# Patient Record
Sex: Female | Born: 1985 | Race: White | Hispanic: No | Smoking: Never smoker
Health system: Southern US, Community
[De-identification: ages and names within clinical notes are randomized; demographics above are authoritative.]

## PROBLEM LIST (undated history)

## (undated) DIAGNOSIS — M1990 Unspecified osteoarthritis, unspecified site: Secondary | ICD-10-CM

## (undated) DIAGNOSIS — F419 Anxiety disorder, unspecified: Secondary | ICD-10-CM

## (undated) DIAGNOSIS — J45909 Unspecified asthma, uncomplicated: Secondary | ICD-10-CM

## (undated) DIAGNOSIS — D649 Anemia, unspecified: Secondary | ICD-10-CM

## (undated) HISTORY — DX: Anemia, unspecified: D64.9

## (undated) HISTORY — DX: Unspecified asthma, uncomplicated: J45.909

## (undated) HISTORY — DX: Anxiety disorder, unspecified: F41.9

## (undated) HISTORY — DX: Unspecified osteoarthritis, unspecified site: M19.90

---
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
## 2007-01-17 ENCOUNTER — Ambulatory Visit: Payer: Self-pay | Admitting: Family Medicine

## 2008-07-18 ENCOUNTER — Telehealth: Payer: Self-pay | Admitting: Family Medicine

## 2008-07-18 ENCOUNTER — Emergency Department (HOSPITAL_COMMUNITY): Admission: EM | Admit: 2008-07-18 | Discharge: 2008-07-18 | Payer: Self-pay | Admitting: Emergency Medicine

## 2010-02-19 ENCOUNTER — Ambulatory Visit: Payer: Self-pay | Admitting: Internal Medicine

## 2010-02-19 LAB — CONVERTED CEMR LAB
ALT: 47 units/L — ABNORMAL HIGH (ref 0–35)
AST: 37 units/L (ref 0–37)
Albumin: 3.7 g/dL (ref 3.5–5.2)
Alkaline Phosphatase: 96 units/L (ref 39–117)
Basophils Absolute: 0 10*3/uL (ref 0.0–0.1)
Basophils Relative: 0.5 % (ref 0.0–3.0)
Calcium: 8.9 mg/dL (ref 8.4–10.5)
Eosinophils Relative: 0.7 % (ref 0.0–5.0)
GFR calc non Af Amer: 103.9 mL/min (ref >60)
Glucose, Urine, Semiquant: NEGATIVE
HCT: 34.8 % — ABNORMAL LOW (ref 36.0–46.0)
HDL: 45.2 mg/dL (ref >39.00)
Hemoglobin: 11.9 g/dL — ABNORMAL LOW (ref 12.0–15.0)
LDL Cholesterol: 116 mg/dL — ABNORMAL HIGH (ref 0–99)
Lymphocytes Relative: 54.9 % — ABNORMAL HIGH (ref 12.0–46.0)
Monocytes Relative: 13.7 % — ABNORMAL HIGH (ref 3.0–12.0)
Neutro Abs: 2.3 10*3/uL (ref 1.4–7.7)
Nitrite: NEGATIVE
Potassium: 3.9 meq/L (ref 3.5–5.1)
RBC: 4.01 M/uL (ref 3.87–5.11)
RDW: 14.8 % — ABNORMAL HIGH (ref 11.5–14.6)
Sodium: 137 meq/L (ref 135–145)
Total CHOL/HDL Ratio: 4
VLDL: 18.8 mg/dL (ref 0.0–40.0)
WBC Urine, dipstick: NEGATIVE
WBC: 7.6 10*3/uL (ref 4.5–10.5)

## 2010-02-21 ENCOUNTER — Emergency Department (HOSPITAL_COMMUNITY): Admission: EM | Admit: 2010-02-21 | Discharge: 2010-02-21 | Payer: Self-pay | Admitting: Family Medicine

## 2010-03-05 ENCOUNTER — Ambulatory Visit: Payer: Self-pay | Admitting: Internal Medicine

## 2010-03-05 DIAGNOSIS — D509 Iron deficiency anemia, unspecified: Secondary | ICD-10-CM | POA: Insufficient documentation

## 2010-03-05 DIAGNOSIS — B2790 Infectious mononucleosis, unspecified without complication: Secondary | ICD-10-CM | POA: Insufficient documentation

## 2010-08-26 NOTE — Miscellaneous (Signed)
Summary: 1989 - 1994  1989 - 1994   Imported By: Maryln Gottron 03/10/2010 10:35:45  _____________________________________________________________________  External Attachment:    Type:   Image     Comment:   External Document

## 2010-08-26 NOTE — Assessment & Plan Note (Signed)
Summary: cpx/cjr   Vital Signs:  Patient profile:   25 year old female Height:      66 inches Weight:      186 pounds BMI:     30.13 Temp:     98.2 degrees F oral Pulse rate:   76 / minute Resp:     14 per minute BP sitting:   130 / 80  (left arm)  Vitals Entered By: Willy Eddy, LPN (March 05, 2010 3:03 PM)  Nutrition Counseling: Patient's BMI is greater than 25 and therefore counseled on weight management options. CC: cpx Is Patient Diabetic? No   CC:  cpx.  History of Present Illness: Hx of iron deficiency anemia ( was a regular bloods donor now stopped) took iron for a while and then stolled heavy menstrual cycles fist noted anemia 4 years ago no family hx of thalasemias on father's side does not feel weak The pt was asked about all immunizations, health maint. services that are appropriate to their age and was given guidance on diet exercize  and weight management   Preventive Screening-Counseling & Management  Alcohol-Tobacco     Smoking Status: never     Tobacco Counseling: not indicated; no tobacco use  Problems Prior to Update: None  Current Problems (verified): 1)  Anemia-iron Deficiency  (ICD-280.9)  Medications Prior to Update: 1)  None  Current Medications (verified): 1)  Nortrel 7/7/7 0.5/0.75/1-35 Mg-Mcg Tabs (Norethin-Eth Estrad Triphasic) .... 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  Past Medical History: Anemia-iron deficiency  Family History: Reviewed  history and no changes required. Family History of Asthma Family History Depression Family History High cholesterol Family History Hypertension  Social History: Reviewed history and no changes required. 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    Physical Exam  General:  Well-developed,well-nourished,in no acute distress; alert,appropriate and cooperative throughout examination Eyes:  No corneal or conjunctival inflammation noted. EOMI. Perrla. Funduscopic exam benign, without hemorrhages, exudates or papilledema. Vision grossly normal. Ears:  External ear exam shows no significant lesions or deformities.  Otoscopic examination reveals clear canals, tympanic membranes are intact bilaterally without bulging, retraction, inflammation or discharge. Hearing is grossly normal bilaterally. Nose:  no external deformity and no nasal discharge.   Mouth:  good dentition and pharynx pink and moist.   Lungs:  Normal respiratory effort, chest expands symmetrically. Lungs are clear to auscultation, no crackles or wheezes. Heart:  Normal rate and regular rhythm. S1 and S2 normal without gallop, murmur, click, rub or other extra sounds. Abdomen:  Bowel sounds positive,abdomen soft and non-tender without masses,question of spleen tip y or hernias noted. Msk:  No deformity or scoliosis noted of thoracic or lumbar spine.   Pulses:  R and L  carotid,radial,femoral,dorsalis pedis and posterior tibial pulses are full and equal bilaterally Extremities:  No clubbing, cyanosis, edema, or deformity noted with normal full range of motion of all joints.   Neurologic:  No cranial nerve deficits noted. Station and gait are normal. Plantar reflexes are down-going bilaterally. DTRs are symmetrical throughout. Sensory, motor and coordinative functions appear intact.   Impression & Recommendations:  Problem # 1:  ANEMIA-IRON DEFICIENCY (ICD-280.9)  Hgb: 11.9 (02/19/2010)   Hct: 34.8 (02/19/2010)   Platelets: 211.0 (02/19/2010) RBC: 4.01 (02/19/2010)   RDW: 14.8 (02/19/2010)   WBC: 7.6 (02/19/2010) MCV: 86.7 (02/19/2010)   MCHC: 34.2 (02/19/2010) TSH: 3.59 (02/19/2010)  Problem # 2:  PREVENTIVE HEALTH CARE (ICD-V70.0) The pt was asked about all immunizations, health maint. services that are appropriate to their age and was given guidance on diet exercize  and weight management  Pap smear: normal (03/01/2010) Td Booster: Historical (01/26/2006)   Chol: 180 (02/19/2010)   HDL: 45.20 (02/19/2010)   LDL: 116 (02/19/2010)   TG: 94.0 (02/19/2010) TSH: 3.59 (02/19/2010)    Discussed using sunscreen, use of alcohol, drug use, self breast exam, routine dental care, routine eye care, schedule for GYN exam, routine physical exam, seat belts, multiple vitamins, osteoporosis prevention, adequate calcium intake in diet, recommendations for immunizations, mammograms and Pap smears.  Discussed exercise and checking cholesterol.  Discussed gun safety, safe sex, and contraception.  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 Use as directed   Immunization  History:  Tetanus/Td Immunization History:    Tetanus/Td:  historical (01/26/2006)    Preventive Care Screening  Pap Smear:    Date:  03/01/2010    Next Due:  02/2011    Results:  normal   Last Tetanus Booster:    Date:  01/26/2006    Results:  Historical

## 2010-09-17 ENCOUNTER — Telehealth: Payer: Self-pay | Admitting: Internal Medicine

## 2010-09-17 NOTE — Telephone Encounter (Signed)
Per dr Lovell Sheehan- she need to go to er for evaluation- mom informed

## 2010-09-17 NOTE — Telephone Encounter (Signed)
Pt has kidney stones. Has been having pain in back that has moved to side. Pts ankles, feet and legs are extremely swollen and pt is very bloated as well.Pts mom is wondering if these anything they could do to help stones pass? Pt leaving to go back to [place] on Tues a.m.  [facility] [address]

## 2011-02-25 ENCOUNTER — Encounter: Payer: Self-pay | Admitting: Internal Medicine

## 2011-03-04 ENCOUNTER — Ambulatory Visit: Payer: Self-pay | Admitting: Internal Medicine

## 2011-04-28 LAB — RAPID STREP SCREEN (MED CTR MEBANE ONLY): Streptococcus, Group A Screen (Direct): NEGATIVE

## 2013-06-19 ENCOUNTER — Encounter: Payer: Self-pay | Admitting: Family

## 2013-06-19 DIAGNOSIS — Z0289 Encounter for other administrative examinations: Secondary | ICD-10-CM

## 2022-09-09 ENCOUNTER — Ambulatory Visit (INDEPENDENT_AMBULATORY_CARE_PROVIDER_SITE_OTHER): Payer: 59 | Admitting: Podiatry

## 2022-09-09 VITALS — BP 125/86

## 2022-09-09 DIAGNOSIS — L600 Ingrowing nail: Secondary | ICD-10-CM

## 2022-09-12 NOTE — Progress Notes (Signed)
   Chief Complaint  Patient presents with   Ingrown Toenail    Patient came in today for bilateral ingrown toenails which started 3 years ago, patient also has 5th toenails that split,     Subjective: Patient presents today for evaluation of pain to the bilateral great toes as well as the fifth digit toenail plates. Patient is concerned for possible ingrown nail.  It is very sensitive to touch.  This has been an ongoing issue for a few years now.  Patient states that the fifth toenails also have a nail spicule that grows and she continues to have to pull it out.  Patient presents today for further treatment and evaluation.  Past Medical History:  Diagnosis Date   Anemia    Not on File  Objective:  General: Well developed, nourished, in no acute distress, alert and oriented x3   Dermatology: Skin is warm, dry and supple bilateral.  Bilateral great toenail plates as well as the lateral border of the fifth digits bilaterally is tender with evidence of an ingrowing nail. Pain on palpation noted to the border of the nail fold. The remaining nails appear unremarkable at this time. There are no open sores, lesions.  Vascular: DP and PT pulses palpable.  No clinical evidence of vascular compromise  Neruologic: Grossly intact via light touch bilateral.  Musculoskeletal: No pedal deformity noted  Assesement: #1 Paronychia with ingrowing nail bilateral great toes and fifth digits bilateral  Plan of Care:  1. Patient evaluated.  2. Discussed treatment alternatives and plan of care. Explained nail avulsion procedure and post procedure course to patient. 3. Patient opted for permanent partial nail avulsion of the ingrown portion of the nail.  For now we are going to pursue just the ingrown portion of the left great toe and left fifth digit.  When she returns for follow-up we will proceed with the right foot 4. Prior to procedure, local anesthesia infiltration utilized using 3 ml of a 50:50  mixture of 2% plain lidocaine and 0.5% plain marcaine in a normal hallux block fashion and a betadine prep performed.  5. Partial permanent nail avulsion with chemical matrixectomy performed using [id number] applications of phenol followed by alcohol flush.  6. Light dressing applied.  Post care instructions provided 7.  Return to clinic 3 weeks  Edrick Kins, DPM [facility]  Dr. Edrick Kins, DPM    [address], [place] [address]                Office [phone]  Fax [phone]

## 2022-09-30 ENCOUNTER — Ambulatory Visit: Payer: 59 | Admitting: Podiatry

## 2022-10-03 ENCOUNTER — Ambulatory Visit (INDEPENDENT_AMBULATORY_CARE_PROVIDER_SITE_OTHER): Payer: 59 | Admitting: Podiatry

## 2022-10-03 DIAGNOSIS — L600 Ingrowing nail: Secondary | ICD-10-CM | POA: Diagnosis not present

## 2022-10-03 NOTE — Patient Instructions (Signed)

## 2022-10-03 NOTE — Progress Notes (Signed)
   Chief Complaint  Patient presents with   Ingrown Toenail    Left hallux and 5th ingrown removal follow-up     Subjective: Patient presents today for evaluation of pain to the bilateral great toes as well as the fifth digit toenail plates. Patient is concerned for possible ingrown nail.  It is very sensitive to touch.  This has been an ongoing issue for a few years now.  Patient states that the fifth toenails also have a nail spicule that grows and she continues to have to pull it out.  Patient presents today for further treatment and evaluation.  Past Medical History:  Diagnosis Date   Anemia    Not on File  Objective:  General: Well developed, nourished, in no acute distress, alert and oriented x3   Dermatology: Skin is warm, dry and supple bilateral.  Bilateral great toenail plates as well as the lateral border of the fifth digits bilaterally is tender with evidence of an ingrowing nail. Pain on palpation noted to the border of the nail fold. The remaining nails appear unremarkable at this time. There are no open sores, lesions.  Vascular: DP and PT pulses palpable.  No clinical evidence of vascular compromise  Neruologic: Grossly intact via light touch bilateral.  Musculoskeletal: No pedal deformity noted  Assesement: #1 Paronychia with ingrowing nail bilateral great toes and fifth digits bilateral #2 S/P partial nail matricectomy LT hallux MED+LAT borders. LT 5th digit LAT border.   Plan of Care:  1. Patient evaluated.  The left foot is doing very well.  There is no drainage and the appear to be healing routinely and nicely.  She would like to proceed now for partial matricectomy's affecting the right foot 2. Discussed treatment alternatives and plan of care. Explained nail avulsion procedure again with the patient and post procedure course to patient. 3. Patient would like to proceed with permanent partial nail avulsion of the ingrown portion of the nail to the right foot.    4. Prior to procedure, local anesthesia infiltration utilized using 3 ml of a 50:50 mixture of 2% plain lidocaine and 0.5% plain marcaine in a normal hallux block fashion and a betadine prep performed.  5. Partial permanent nail avulsion with chemical matrixectomy performed using [id number] applications of phenol followed by alcohol flush.  6. Light dressing applied.  Post care instructions provided 7.  Return to clinic 3 weeks  Edrick Kins, DPM [facility]  Dr. Edrick Kins, DPM    [address],  [address]                Office [phone]  Fax [phone]

## 2022-10-13 ENCOUNTER — Ambulatory Visit: Payer: 59 | Admitting: Nurse Practitioner

## 2022-10-13 ENCOUNTER — Encounter: Payer: Self-pay | Admitting: Nurse Practitioner

## 2022-10-17 ENCOUNTER — Ambulatory Visit: Payer: 59 | Admitting: Podiatry

## 2022-11-04 ENCOUNTER — Encounter: Payer: Self-pay | Admitting: Podiatry

## 2022-12-01 ENCOUNTER — Ambulatory Visit: Payer: 59 | Admitting: Nurse Practitioner

## 2022-12-01 ENCOUNTER — Encounter: Payer: Self-pay | Admitting: Nurse Practitioner

## 2022-12-01 VITALS — BP 122/78 | HR 88 | Ht 64.75 in | Wt 138.6 lb

## 2022-12-01 DIAGNOSIS — Z0000 Encounter for general adult medical examination without abnormal findings: Secondary | ICD-10-CM | POA: Insufficient documentation

## 2022-12-01 DIAGNOSIS — Z1329 Encounter for screening for other suspected endocrine disorder: Secondary | ICD-10-CM | POA: Diagnosis not present

## 2022-12-01 DIAGNOSIS — Z79899 Other long term (current) drug therapy: Secondary | ICD-10-CM

## 2022-12-01 DIAGNOSIS — L600 Ingrowing nail: Secondary | ICD-10-CM | POA: Diagnosis not present

## 2022-12-01 DIAGNOSIS — Z130 Encounter for screening for diseases of the blood and blood-forming organs and certain disorders involving the immune mechanism: Secondary | ICD-10-CM

## 2022-12-01 DIAGNOSIS — M533 Sacrococcygeal disorders, not elsewhere classified: Secondary | ICD-10-CM

## 2022-12-01 DIAGNOSIS — Z1321 Encounter for screening for nutritional disorder: Secondary | ICD-10-CM

## 2022-12-01 DIAGNOSIS — Z862 Personal history of diseases of the blood and blood-forming organs and certain disorders involving the immune mechanism: Secondary | ICD-10-CM

## 2022-12-01 DIAGNOSIS — Z13228 Encounter for screening for other metabolic disorders: Secondary | ICD-10-CM

## 2022-12-01 DIAGNOSIS — Z8349 Family history of other endocrine, nutritional and metabolic diseases: Secondary | ICD-10-CM | POA: Insufficient documentation

## 2022-12-01 DIAGNOSIS — R519 Headache, unspecified: Secondary | ICD-10-CM

## 2022-12-01 LAB — CBC WITH DIFFERENTIAL/PLATELET
Basos: 1 %
EOS (ABSOLUTE): 0 10*3/uL (ref 0.0–0.4)
Hematocrit: 42.8 % (ref 34.0–46.6)
Hemoglobin: 14.2 g/dL (ref 11.1–15.9)
Immature Granulocytes: 0 %
Lymphs: 30 %
MCHC: 33.2 g/dL (ref 31.5–35.7)
MCV: 90 fL (ref 79–97)
Monocytes Absolute: 0.6 10*3/uL (ref 0.1–0.9)
Neutrophils Absolute: 6.4 10*3/uL (ref 1.4–7.0)
Neutrophils: 63 %
RDW: 11.7 % (ref 11.7–15.4)

## 2022-12-01 LAB — COMPREHENSIVE METABOLIC PANEL

## 2022-12-01 LAB — LIPID PANEL

## 2022-12-01 LAB — VITAMIN B12

## 2022-12-01 NOTE — Progress Notes (Signed)
[name], DNP, AGNP-c [facility] [address] [place], [place] [address] Main Office [phone]  BP 122/78   Pulse 88   Ht 5' 4.75" (1.645 m)   Wt 138 lb 9.6 oz (62.9 kg)   BMI 23.24 kg/m    Subjective:    Patient ID: Sharon Clements, female    DOB: [birth year], 37 y.o.   MRN: [id number]  HPI: Sharon Clements is a 37 y.o. female presenting on 12/01/2022 for comprehensive medical examination.   Current medical concerns include: Sharon Clements expresses concerns today with frequent headaches, occurring 4-5 times per week.  She notes that these headaches are severe but not at the level of migraines.  Sharon Clements attributes the headaches to extensive screen time related to her job, which demands 12 hours of work daily primarily on the computer.  She suspects that an adequate sleep and hydration may also be contributing factors.  Previously she managed the headaches with ibuprofen but since has switched to acetaminophen.  Sharon Clements also mentions a concern regarding possible to send to help bone, and condition she notes her mother has also.  She describes the sensation as palpable and painful, particularly when sitting in a standard position, which forces her to frequently shift her weight or alter her sitting posture to alleviate discomfort.  Additionally, she discusses significant weight loss of 50 to 60 pounds following family bereavement in 2022.  She believes that the weight loss has made the tailbone issue more pronounced.  Sharon Clements also mentions a chronic runny nose, which she asked.  This is year-round and not limited to seasonal allergies.  This condition leads to consistent mucus in her throat, which she feels the need to clear frequently.  Sharon Clements mentions recent concerns about a potentially infected ingrown toenail following a removal procedure in [place].  She describes the toe is tender, dark-colored, and swollen, with increased pain when wearing shoes or  when the foot contacts the shoe.  She is concerned about infection and plans to resume soaking the toe in Epsom salt as part of her care regimen.   Pertinent items are noted in HPI.  IMMUNIZATIONS:   Flu: Flu vaccine postponed until flu season Prevnar 13: Prevnar 13 N/A for this patient Prevnar 20: Prevnar 20 N/A for this patient Pneumovax 23: Pneumovax 23 N/A for this patient Vac Shingrix: Shingrix N/A for this patient HPV: HPV completed, documentation in chart (Dose # 2/2) Tetanus: Tetanus vaccine history is needed to be updated COVID: COVID-vaccine declined at this time.  HEALTH MAINTENANCE: Pap Smear HM Status: Reportedly up-to-date, records needed. Mammogram N/A Colon Cancer Screening N/A Bone Density N/A STI Testing HM Status: was declined  Lung CT N/A  She reports regular vision exams q1-5y: Yes  She reports regular dental exams q 82m:  Yes  The patient eats a regular, healthy diet.  Most Recent Depression Screen:     12/01/2022    2:21 PM  Depression screen PHQ 2/9  Decreased Interest 0  Down, Depressed, Hopeless 0  PHQ - 2 Score 0   Most Recent Anxiety Screen:      No data to display         Most Recent Fall Screen:    12/01/2022    2:20 PM  Fall Risk   Falls in the past year? 0  Number falls in past yr: 0  Injury with Fall? 0  Risk for fall due to : No Fall Risks  Follow up Falls evaluation completed  Past medical history, surgical history, medications, allergies, family history and social history reviewed with patient today and changes made to appropriate areas of the chart.  Past Medical History:  Past Medical History:  Diagnosis Date   Anemia    Family history of B12 deficiency 12/01/2022   Medications:  Current Outpatient Medications on File Prior to Visit  Medication Sig   Biotin 1000 MCG tablet Take 1 tablet by mouth daily.   Cyanocobalamin 2500 MCG TABS Take 1 tablet by mouth daily.   JUNEL 1.5/30 1.5-30 MG-MCG tablet Take 1 tablet by  mouth daily.   Multiple Vitamin (MULTI-VITAMIN) tablet Take 1 tablet by mouth daily.   phentermine (ADIPEX-P) 37.5 MG tablet Take 37.5 mg by mouth daily.   spironolactone (ALDACTONE) 100 MG tablet Take 100 mg by mouth 2 (two) times daily.   Vitamin E 45 MG (100 UNIT) CAPS Take 1 capsule by mouth daily.   tretinoin (RETIN-A) 0.025 % gel 1 APPLICATION ONCE A NIGHT PEA SIZE AMOUNT EXTERNALLY 30 DAYS for 30 days   No current facility-administered medications on file prior to visit.   Surgical History:  History reviewed. No pertinent surgical history. Allergies:  Not on File Family History:  Family History  Problem Relation Age of Onset   Asthma Other        fhx   Depression Other        fhx   Hyperlipidemia Other        fhx   Hypertension Other        fhx       Objective:    BP 122/78   Pulse 88   Ht 5' 4.75" (1.645 m)   Wt 138 lb 9.6 oz (62.9 kg)   BMI 23.24 kg/m   Wt Readings from Last 3 Encounters:  12/01/22 138 lb 9.6 oz (62.9 kg)    Physical Exam Vitals and nursing note reviewed.  Constitutional:      General: She is not in acute distress.    Appearance: Normal appearance.  HENT:     Head: Normocephalic and atraumatic.     Right Ear: Hearing, tympanic membrane, ear canal and external ear normal.     Left Ear: Hearing, tympanic membrane, ear canal and external ear normal.     Nose: Nose normal.     Right Sinus: No maxillary sinus tenderness or frontal sinus tenderness.     Left Sinus: No maxillary sinus tenderness or frontal sinus tenderness.     Mouth/Throat:     Lips: Pink.     Mouth: Mucous membranes are moist.     Pharynx: Oropharynx is clear.  Eyes:     General: Lids are normal. Vision grossly intact.     Extraocular Movements: Extraocular movements intact.     Conjunctiva/sclera: Conjunctivae normal.     Pupils: Pupils are equal, round, and reactive to light.     Funduscopic exam:    Right eye: Red reflex present.        Left eye: Red reflex  present.    Visual Fields: Right eye visual fields normal and left eye visual fields normal.  Neck:     Thyroid: No thyromegaly.     Vascular: No carotid bruit.  Cardiovascular:     Rate and Rhythm: Normal rate and regular rhythm.     Chest Wall: PMI is not displaced.     Pulses: Normal pulses.          Dorsalis pedis pulses are 2+ on the right  side and 2+ on the left side.       Posterior tibial pulses are 2+ on the right side and 2+ on the left side.     Heart sounds: Normal heart sounds. No murmur heard. Pulmonary:     Effort: Pulmonary effort is normal. No respiratory distress.     Breath sounds: Normal breath sounds.  Abdominal:     General: Abdomen is flat. Bowel sounds are normal. There is no distension.     Palpations: Abdomen is soft. There is no hepatomegaly, splenomegaly or mass.     Tenderness: There is no abdominal tenderness. There is no right CVA tenderness, left CVA tenderness, guarding or rebound.  Musculoskeletal:        General: Tenderness present.     Cervical back: Full passive range of motion without pain, normal range of motion and neck supple. No tenderness.     Right lower leg: No edema.     Left lower leg: No edema.       Legs:  Feet:     Left foot:     Toenail Condition: Left toenails are normal.     Comments: Great toe on right foot tender and mildly erythematous with no drainage noted on palpation. Discoloration present to the nail.  Lymphadenopathy:     Cervical: No cervical adenopathy.     Upper Body:     Right upper body: No supraclavicular adenopathy.     Left upper body: No supraclavicular adenopathy.  Skin:    General: Skin is warm and dry.     Capillary Refill: Capillary refill takes less than 2 seconds.     Nails: There is no clubbing.  Neurological:     General: No focal deficit present.     Mental Status: She is alert and oriented to person, place, and time.     GCS: GCS eye subscore is 4. GCS verbal subscore is 5. GCS motor subscore is  6.     Sensory: Sensation is intact.     Motor: Motor function is intact.     Coordination: Coordination is intact.     Gait: Gait is intact.     Deep Tendon Reflexes: Reflexes are normal and symmetric.  Psychiatric:        Attention and Perception: Attention normal.        Mood and Affect: Mood normal.        Speech: Speech normal.        Behavior: Behavior normal. Behavior is cooperative.        Thought Content: Thought content normal.        Cognition and Memory: Cognition and memory normal.        Judgment: Judgment normal.     Results for orders placed or performed in visit on 12/01/22  CBC with Differential/Platelet  Result Value Ref Range   WBC 10.1 3.4 - 10.8 x10E3/uL   RBC 4.77 3.77 - 5.28 x10E6/uL   Hemoglobin 14.2 11.1 - 15.9 g/dL   Hematocrit 40.9 81.1 - 46.6 %   MCV 90 79 - 97 fL   MCH 29.8 26.6 - 33.0 pg   MCHC 33.2 31.5 - 35.7 g/dL   RDW 91.4 78.2 - 95.6 %   Platelets 360 150 - 450 x10E3/uL   Neutrophils 63 Not Estab. %   Lymphs 30 Not Estab. %   Monocytes 6 Not Estab. %   Eos 0 Not Estab. %   Basos 1 Not Estab. %   Neutrophils Absolute  6.4 1.4 - 7.0 x10E3/uL   Lymphocytes Absolute 3.0 0.7 - 3.1 x10E3/uL   Monocytes Absolute 0.6 0.1 - 0.9 x10E3/uL   EOS (ABSOLUTE) 0.0 0.0 - 0.4 x10E3/uL   Basophils Absolute 0.1 0.0 - 0.2 x10E3/uL   Immature Granulocytes 0 Not Estab. %   Immature Grans (Abs) 0.0 0.0 - 0.1 x10E3/uL  Comprehensive metabolic panel  Result Value Ref Range   Glucose 83 70 - 99 mg/dL   BUN 10 6 - 20 mg/dL   Creatinine, Ser 4.09 0.57 - 1.00 mg/dL   eGFR 85 >81 XB/JYN/8.29   BUN/Creatinine Ratio 11 9 - 23   Sodium 134 134 - 144 mmol/L   Potassium 4.3 3.5 - 5.2 mmol/L   Chloride 99 96 - 106 mmol/L   CO2 20 20 - 29 mmol/L   Calcium 10.1 8.7 - 10.2 mg/dL   Total Protein 7.3 6.0 - 8.5 g/dL   Albumin 4.7 3.9 - 4.9 g/dL   Globulin, Total 2.6 1.5 - 4.5 g/dL   Albumin/Globulin Ratio 1.8 1.2 - 2.2   Bilirubin Total 0.6 0.0 - 1.2 mg/dL   Alkaline  Phosphatase 54 44 - 121 IU/L   AST 17 0 - 40 IU/L   ALT 16 0 - 32 IU/L  Vitamin B12  Result Value Ref Range   Vitamin B-12 1,292 (H) 232 - 1,245 pg/mL  Lipid panel  Result Value Ref Range   Cholesterol, Total 205 (H) 100 - 199 mg/dL   Triglycerides 562 0 - 149 mg/dL   HDL 89 >13 mg/dL   VLDL Cholesterol Cal 17 5 - 40 mg/dL   LDL Chol Calc (NIH) 99 0 - 99 mg/dL   Chol/HDL Ratio 2.3 0.0 - 4.4 ratio  VITAMIN D 25 Hydroxy (Vit-D Deficiency, Fractures)  Result Value Ref Range   Vit D, 25-Hydroxy 73.7 30.0 - 100.0 ng/mL  TSH  Result Value Ref Range   TSH 1.670 0.450 - 4.500 uIU/mL         Assessment & Plan:   Problem List Items Addressed This Visit     Encounter for annual physical exam - Primary    CPE completed today.   Labs ordered. Will make changes as necessary based on results.  Review of HM activities and recommendations discussed and provided on AVS Anticipatory guidance, diet, and exercise recommendations provided.  Medications, allergies, and hx reviewed and updated as necessary.  Plan to f/u with CPE in 1 year or sooner for acute/chronic health needs as directed.        Relevant Orders   CBC with Differential/Platelet (Completed)   Comprehensive metabolic panel (Completed)   Vitamin B12 (Completed)   Lipid panel (Completed)   VITAMIN D 25 Hydroxy (Vit-D Deficiency, Fractures) (Completed)   TSH (Completed)   High risk medication use    Chronic use of tretinoin, OCP, spironolactone, phentermine, and vitamin replacement. Will monitor labs today for management.       Relevant Orders   CBC with Differential/Platelet (Completed)   Comprehensive metabolic panel (Completed)   Vitamin B12 (Completed)   Lipid panel (Completed)   VITAMIN D 25 Hydroxy (Vit-D Deficiency, Fractures) (Completed)   TSH (Completed)   History of anemia due to vitamin B12 deficiency    Labs pending.       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 Noted most recently following weight loss. Ongoing issue. No saddle symptoms or alarms present today. 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  Plan: - Try sitting on a neck pillow or donut pillow to help relieve direct pressure when sitting.  - We can always place a referral to a spinal specialist for further evaluation, if you would like.       Frequent headaches    Sharon Clements has been experiencing 4-5 headaches a week, not at migraine level, but still significant. We discussed possible etiology and treatment options that may be helpful today.  Plan: -Sample of Nurtec has been provided for patient to try as needed to see if this is helpful with breaking the cycle headaches. -Avoid using Tylenol or ibuprofen on a daily basis as this can cause rebound headaches - If symptoms worsen or do not improve, we can consider preventative medications.       Ingrown toenail    Tenderness, discoloration, and swelling noted to the great toe on the right foot following recent procedure with podiatry.  Plan: - Resume soaking in Epsom salts daily - Avoid wearing tight fitting shoes to prevent increased pressure and friction on the area - Contact podiatry if you note any signs of drainage or worsening symptoms to indicate possible infection.       Other Visit Diagnoses     Health care maintenance       Relevant Orders   CBC with Differential/Platelet (Completed)   Comprehensive metabolic panel (Completed)   Vitamin B12 (Completed)   Lipid panel (Completed)   VITAMIN D 25 Hydroxy (Vit-D Deficiency, Fractures) (Completed)   TSH (Completed)   Screening for endocrine, nutritional, metabolic and immunity disorder        Relevant Orders   CBC with Differential/Platelet (Completed)   Comprehensive metabolic panel (Completed)   Vitamin B12 (Completed)   Lipid panel (Completed)   VITAMIN D 25 Hydroxy (Vit-D Deficiency, Fractures) (Completed)   TSH (Completed)          Follow up plan: Return in about 1 year (around 12/01/2023) for CPE.  NEXT PREVENTATIVE PHYSICAL DUE IN 1 YEAR.  PATIENT COUNSELING PROVIDED FOR ALL ADULT PATIENTS: A well balanced diet low in saturated fats, cholesterol, and moderation in carbohydrates.  This can be as simple as monitoring portion sizes and cutting back on sugary beverages such as soda and juice to start with.    Daily water consumption of at least 64 ounces.  Physical activity at least 180 minutes per week.  If just starting out, start 10 minutes a day and work your way up.   This can be as simple as taking the stairs instead of the elevator and walking 2-3 laps around the office  purposefully every day.   STD protection, partner selection, and regular testing if high risk.  Limited consumption of alcoholic beverages if alcohol is consumed. For men, I recommend no more than 14 alcoholic beverages per week, spread out throughout the week (max 2 per day). Avoid "binge" drinking or consuming large quantities of alcohol in one setting.  Please let me know if you feel you may need help with reduction or quitting alcohol consumption.   Avoidance of nicotine, if used. Please let me know if you feel you may need help with reduction or quitting nicotine use.   Daily mental health attention. This can  be in the form of 5 minute daily meditation, prayer, journaling, yoga, reflection, etc.  Purposeful attention to your emotions and mental state can significantly improve your overall wellbeing  and  Health.  Please know that I am here to help you with all of your health care goals and am happy to work with you to find a solution that works best for you.  The greatest advice I  have received with any changes in life are to take it one step at a time, that even means if all you can focus on is the next 60 seconds, then do that and celebrate your victories.  With any changes in life, you will have set backs, and that is OK. The important thing to remember is, if you have a set back, it is not a failure, it is an opportunity to try again! Screening Testing Mammogram Every 1 -2 years based on history and risk factors Starting at age 54 Pap Smear Ages 21-39 every 3 years Ages 22-65 every 5 years with HPV testing More frequent testing may be required based on results and history Colon Cancer Screening Every 1-10 years based on test performed, risk factors, and history Starting at age 65 Bone Density Screening Every 2-10 years based on history Starting at age 73 for women Recommendations for men differ based on medication usage, history, and risk factors AAA Screening One time ultrasound Men 22-20 years old who have every smoked Lung Cancer Screening Low Dose Lung CT every 12 months Age 47-80 years with a 30 pack-year smoking history who still smoke or who have quit within the last 15 years   Screening Labs Routine  Labs: Complete Blood Count (CBC), Complete Metabolic Panel (CMP), Cholesterol (Lipid Panel) Every 6-12 months based on history and medications May be recommended more frequently based on current conditions or previous results Hemoglobin A1c Lab Every 3-12 months based on history and previous results Starting at age 72 or earlier with diagnosis of diabetes, high cholesterol, BMI >26, and/or risk factors Frequent monitoring for patients with diabetes to ensure blood sugar control Thyroid Panel (TSH) Every 6 months based on history, symptoms, and risk factors May be repeated more often if on medication HIV One time testing for all patients 78 and older May be repeated more frequently for patients with increased risk factors or exposure Hepatitis  C One time testing for all patients 44 and older May be repeated more frequently for patients with increased risk factors or exposure Gonorrhea, Chlamydia Every 12 months for all sexually active persons 13-24 years Additional monitoring may be recommended for those who are considered high risk or who have symptoms Every 12 months for any woman on birth control, regardless of sexual activity PSA Men 71-8 years old with risk factors Additional screening may be recommended from age 1-69 based on risk factors, symptoms, and history  Vaccine Recommendations Tetanus Booster All adults every 10 years Flu Vaccine All patients 6 months and older every year COVID Vaccine All patients 12 years and older Initial dosing with booster May recommend additional booster based on age and health history HPV Vaccine 2 doses all patients age 48-26 Dosing may be considered for patients over 26 Shingles Vaccine (Shingrix) 2 doses all adults 55 years and older Pneumonia (Pneumovax 68) All adults 65 years and older May recommend earlier dosing based on health history One year apart from Prevnar 28 Pneumonia (Prevnar 78) All adults 65 years and older Dosed 1 year after Pneumovax 23 Pneumonia (Prevnar  20) One time alternative to the two dosing of 13 and 23 For all adults with initial dose of 23, 20 is recommended 1 year later For all adults with initial dose of 13, 23 is still recommended as second option 1 year later

## 2022-12-01 NOTE — Patient Instructions (Signed)
I will senda note to the podiatrist about the toe, please watch it carefully.   For all adult patients, I recommend A well balanced diet low in saturated fats, cholesterol, and moderation in carbohydrates.   This can be as simple as monitoring portion sizes and cutting back on sugary beverages such as soda and juice to start with.    Daily water consumption of at least 64 ounces.  Physical activity at least 180 minutes per week, if just starting out.   This can be as simple as taking the stairs instead of the elevator and walking 2-3 laps around the office  purposefully every day.   STD protection, partner selection, and regular testing if high risk.  Limited consumption of alcoholic beverages if alcohol is consumed.  For women, I recommend no more than 7 alcoholic beverages per week, spread out throughout the week.  Avoid "binge" drinking or consuming large quantities of alcohol in one setting.   Please let me know if you feel you may need help with reduction or quitting alcohol consumption.   Avoidance of nicotine, if used.  Please let me know if you feel you may need help with reduction or quitting nicotine use.   Daily mental health attention.  This can be in the form of 5 minute daily meditation, prayer, journaling, yoga, reflection, etc.   Purposeful attention to your emotions and mental state can significantly improve your overall wellbeing  and  Health.  Please know that I am here to help you with all of your health care goals and am happy to work with you to find a solution that works best for you.  The greatest advice I have received with any changes in life are to take it one step at a time, that even means if all you can focus on is the next 60 seconds, then do that and celebrate your victories.  With any changes in life, you will have set backs, and that is OK. The important thing to remember is, if you have a set back, it is not a failure, it is an opportunity to try  again!  Health Maintenance Recommendations Screening Testing Mammogram Every 1 -2 years based on history and risk factors Starting at age 37 Pap Smear Ages 21-39 every 3 years Ages 48-65 every 5 years with HPV testing More frequent testing may be required based on results and history Colon Cancer Screening Every 1-10 years based on test performed, risk factors, and history Starting at age 21 Bone Density Screening Every 2-10 years based on history Starting at age 51 for women Recommendations for men differ based on medication usage, history, and risk factors AAA Screening One time ultrasound Men 44-74 years old who have every smoked Lung Cancer Screening Low Dose Lung CT every 12 months Age 6-80 years with a 30 pack-year smoking history who still smoke or who have quit within the last 15 years  Screening Labs Routine  Labs: Complete Blood Count (CBC), Complete Metabolic Panel (CMP), Cholesterol (Lipid Panel) Every 6-12 months based on history and medications May be recommended more frequently based on current conditions or previous results Hemoglobin A1c Lab Every 3-12 months based on history and previous results Starting at age 71 or earlier with diagnosis of diabetes, high cholesterol, BMI >26, and/or risk factors Frequent monitoring for patients with diabetes to ensure blood sugar control Thyroid Panel (TSH w/ T3 & T4) Every 6 months based on history, symptoms, and risk factors May be repeated more often  if on medication HIV One time testing for all patients 13 and older May be repeated more frequently for patients with increased risk factors or exposure Hepatitis C One time testing for all patients 18 and older May be repeated more frequently for patients with increased risk factors or exposure Gonorrhea, Chlamydia Every 12 months for all sexually active persons 13-24 years Additional monitoring may be recommended for those who are considered high risk or who have  symptoms PSA Men 54-93 years old with risk factors Additional screening may be recommended from age 17-69 based on risk factors, symptoms, and history  Vaccine Recommendations Tetanus Booster All adults every 10 years Flu Vaccine All patients 6 months and older every year COVID Vaccine All patients 12 years and older Initial dosing with booster May recommend additional booster based on age and health history HPV Vaccine 2 doses all patients age 17-26 Dosing may be considered for patients over 26 Shingles Vaccine (Shingrix) 2 doses all adults 55 years and older Pneumonia (Pneumovax 23) All adults 65 years and older May recommend earlier dosing based on health history Pneumonia (Prevnar 38) All adults 65 years and older Dosed 1 year after Pneumovax 23  Additional Screening, Testing, and Vaccinations may be recommended on an individualized basis based on family history, health history, risk factors, and/or exposure.

## 2022-12-02 LAB — COMPREHENSIVE METABOLIC PANEL
Bilirubin Total: 0.6 mg/dL (ref 0.0–1.2)
Chloride: 99 mmol/L (ref 96–106)
Creatinine, Ser: 0.9 mg/dL (ref 0.57–1.00)
Glucose: 83 mg/dL (ref 70–99)
Potassium: 4.3 mmol/L (ref 3.5–5.2)
eGFR: 85 mL/min/{1.73_m2} (ref >59)

## 2022-12-02 LAB — VITAMIN D 25 HYDROXY (VIT D DEFICIENCY, FRACTURES): Vit D, 25-Hydroxy: 73.7 ng/mL (ref 30.0–100.0)

## 2022-12-02 LAB — CBC WITH DIFFERENTIAL/PLATELET
Basophils Absolute: 0.1 10*3/uL (ref 0.0–0.2)
Eos: 0 %
Immature Grans (Abs): 0 10*3/uL (ref 0.0–0.1)
Lymphocytes Absolute: 3 10*3/uL (ref 0.7–3.1)
MCH: 29.8 pg (ref 26.6–33.0)
Monocytes: 6 %
Platelets: 360 10*3/uL (ref 150–450)
RBC: 4.77 x10E6/uL (ref 3.77–5.28)
WBC: 10.1 10*3/uL (ref 3.4–10.8)

## 2022-12-02 LAB — LIPID PANEL
Chol/HDL Ratio: 2.3 ratio (ref 0.0–4.4)
LDL Chol Calc (NIH): 99 mg/dL (ref 0–99)
Triglycerides: 100 mg/dL (ref 0–149)

## 2022-12-02 LAB — TSH: TSH: 1.67 u[IU]/mL (ref 0.450–4.500)

## 2022-12-20 ENCOUNTER — Encounter: Payer: Self-pay | Admitting: Nurse Practitioner

## 2022-12-20 DIAGNOSIS — M533 Sacrococcygeal disorders, not elsewhere classified: Secondary | ICD-10-CM

## 2022-12-20 DIAGNOSIS — L600 Ingrowing nail: Secondary | ICD-10-CM

## 2022-12-20 DIAGNOSIS — R519 Headache, unspecified: Secondary | ICD-10-CM | POA: Insufficient documentation

## 2022-12-20 HISTORY — DX: Ingrowing nail: L60.0

## 2022-12-20 HISTORY — DX: Sacrococcygeal disorders, not elsewhere classified: M53.3

## 2022-12-20 NOTE — Assessment & Plan Note (Signed)
Tenderness, discoloration, and swelling noted to the great toe on the right foot following recent procedure with podiatry.  Plan: - Resume soaking in Epsom salts daily - Avoid wearing tight fitting shoes to prevent increased pressure and friction on the area - Contact podiatry if you note any signs of drainage or worsening symptoms to indicate possible infection.

## 2022-12-20 NOTE — Assessment & Plan Note (Signed)
Chronic use of tretinoin, OCP, spironolactone, phentermine, and vitamin replacement. Will monitor labs today for management.

## 2022-12-20 NOTE — Assessment & Plan Note (Signed)
Labs pending.  

## 2022-12-20 NOTE — Assessment & Plan Note (Signed)
CPE completed today.   Labs ordered. Will make changes as necessary based on results.  Review of HM activities and recommendations discussed and provided on AVS Anticipatory guidance, diet, and exercise recommendations provided.  Medications, allergies, and hx reviewed and updated as necessary.  Plan to f/u with CPE in 1 year or sooner for acute/chronic health needs as directed.   

## 2022-12-20 NOTE — Assessment & Plan Note (Signed)
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 Noted most recently following weight loss. Ongoing issue. No saddle symptoms or alarms present today. 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  Plan: - Try sitting on a neck pillow or donut pillow to help relieve direct pressure when sitting.  - We can always place a referral to a spinal specialist for further evaluation, if you would like.

## 2022-12-20 NOTE — Assessment & Plan Note (Signed)
[name] has been experiencing 4-5 headaches a week, not at migraine level, but still significant. We discussed possible etiology and treatment options that may be helpful today.  Plan: -Sample of Nurtec has been provided for patient to try as needed to see if this is helpful with breaking the cycle headaches. -Avoid using Tylenol or ibuprofen on a daily basis as this can cause rebound headaches - If symptoms worsen or do not improve, we can consider preventative medications.

## 2022-12-22 ENCOUNTER — Encounter: Payer: Self-pay | Admitting: Nurse Practitioner

## 2022-12-28 ENCOUNTER — Ambulatory Visit (INDEPENDENT_AMBULATORY_CARE_PROVIDER_SITE_OTHER): Payer: 59 | Admitting: Podiatry

## 2022-12-28 DIAGNOSIS — L600 Ingrowing nail: Secondary | ICD-10-CM

## 2022-12-28 NOTE — Progress Notes (Signed)
   Chief Complaint  Patient presents with   Ingrown Toenail    Ingrown nail check right hallux, and bilateral 5th toes,     Subjective: Patient presents today for follow-up evaluation after partial nail matricectomy to the medial border of the bilateral great toes as well as the fifth digits bilateral.  She states that she has had some sensitivity and tenderness to the medial border of the right great toe.  This is been ongoing for about a month now.  Presenting for follow-up treatment and evaluation  Past Medical History:  Diagnosis Date   Anemia    Family history of B12 deficiency 12/01/2022   Not on File  Objective:  General: Well developed, nourished, in no acute distress, alert and oriented x3   Dermatology: Skin is warm, dry and supple bilateral.  There does appear to be a portion of nail and subungual debris embedded within the medial nail fold causing the patient's symptoms.  After debridement the patient did feel some relief  Vascular: DP and PT pulses palpable.  No clinical evidence of vascular compromise  Neruologic: Grossly intact via light touch bilateral.  Musculoskeletal: No pedal deformity noted  Assesement: #1 S/P partial nail matricectomy bilateral great toes and fifth digits  Plan of Care:  -Patient evaluated -Debridement of the nail matricectomy sites was performed today using a tissue nipper.  The patient tolerated this well.  After debridement of the patient did feel some relief -No drainage or clinical indication of recurrent ingrown or infection -Return to clinic as needed  Felecia Shelling, DPM [facility]  Dr. Felecia Shelling, DPM    [address] [place], [place] [address]                Office [phone]  Fax [phone]

## 2022-12-28 NOTE — Patient Instructions (Signed)

## 2023-01-09 ENCOUNTER — Ambulatory Visit: Payer: 59 | Admitting: Podiatry

## 2023-12-08 ENCOUNTER — Encounter: Payer: 59 | Admitting: Nurse Practitioner

## 2023-12-21 ENCOUNTER — Ambulatory Visit (INDEPENDENT_AMBULATORY_CARE_PROVIDER_SITE_OTHER): Admitting: Nurse Practitioner

## 2023-12-21 VITALS — BP 118/80 | HR 61 | Ht 65.0 in | Wt 141.4 lb

## 2023-12-21 DIAGNOSIS — Z862 Personal history of diseases of the blood and blood-forming organs and certain disorders involving the immune mechanism: Secondary | ICD-10-CM

## 2023-12-21 DIAGNOSIS — Z23 Encounter for immunization: Secondary | ICD-10-CM

## 2023-12-21 DIAGNOSIS — L858 Other specified epidermal thickening: Secondary | ICD-10-CM | POA: Insufficient documentation

## 2023-12-21 DIAGNOSIS — K5909 Other constipation: Secondary | ICD-10-CM | POA: Insufficient documentation

## 2023-12-21 DIAGNOSIS — Z0000 Encounter for general adult medical examination without abnormal findings: Secondary | ICD-10-CM | POA: Diagnosis not present

## 2023-12-21 DIAGNOSIS — Z130 Encounter for screening for diseases of the blood and blood-forming organs and certain disorders involving the immune mechanism: Secondary | ICD-10-CM

## 2023-12-21 DIAGNOSIS — Z1329 Encounter for screening for other suspected endocrine disorder: Secondary | ICD-10-CM

## 2023-12-21 DIAGNOSIS — Z79899 Other long term (current) drug therapy: Secondary | ICD-10-CM | POA: Insufficient documentation

## 2023-12-21 DIAGNOSIS — L719 Rosacea, unspecified: Secondary | ICD-10-CM | POA: Insufficient documentation

## 2023-12-21 DIAGNOSIS — Z1321 Encounter for screening for nutritional disorder: Secondary | ICD-10-CM

## 2023-12-21 DIAGNOSIS — D224 Melanocytic nevi of scalp and neck: Secondary | ICD-10-CM | POA: Insufficient documentation

## 2023-12-21 DIAGNOSIS — D2339 Other benign neoplasm of skin of other parts of face: Secondary | ICD-10-CM | POA: Insufficient documentation

## 2023-12-21 DIAGNOSIS — Z8639 Personal history of other endocrine, nutritional and metabolic disease: Secondary | ICD-10-CM | POA: Insufficient documentation

## 2023-12-21 DIAGNOSIS — L700 Acne vulgaris: Secondary | ICD-10-CM | POA: Insufficient documentation

## 2023-12-21 DIAGNOSIS — Z13228 Encounter for screening for other metabolic disorders: Secondary | ICD-10-CM

## 2023-12-21 NOTE — Assessment & Plan Note (Signed)
 Recheck labs

## 2023-12-21 NOTE — Assessment & Plan Note (Signed)
 Chronic constipation likely due to inadequate hydration. Reports difficulty with bowel movements. No current use of laxatives or stool softeners. Discussed potential use of Miralax, which increases intestinal water content, and stool softeners like Colace as alternatives. - Increase water intake - Consider Miralax or Colace if needed

## 2023-12-21 NOTE — Addendum Note (Signed)
 Addended by: Seymour Dapper, Jannine Abreu D on: 12/21/2023 02:55 PM   Modules accepted: Orders

## 2023-12-21 NOTE — Patient Instructions (Signed)

## 2023-12-21 NOTE — Progress Notes (Signed)
 Annella Kief, DNP, AGNP-c [facility] [address] [place], [place] [address] [phone]   ACUTE VISIT- ESTABLISHED PATIENT  Blood pressure 118/80, pulse 61, height 5\' 5"  (1.651 m), weight 141 lb 6.4 oz (64.1 kg), last menstrual period 12/04/2023.  Subjective:  History of Present Illness ANAIYA WISINSKI is a 38 year old female who presents for an annual physical exam.  She is currently taking spironolactone 100 mg twice a day for acne, which has been effective in managing her cystic acne. She used to experience painful cystic lesions but reports significant improvement with no need for dosage adjustment over the years.  She has a history of constipation, which she attributes to inadequate water intake. She manages it by increasing her water consumption, although she finds it challenging to maintain adequate hydration. She has not used any laxatives or stool softeners. She experiences headaches, which she believes are related to dehydration, as they often occur when she realizes she has not consumed enough water.  She has a history of vitamin D  deficiency, for which she takes a supplement every three days. She switched to lactate milk a few years ago to avoid bloating, although she does not have a lactose sensitivity.  No issues with hearing, vision, throat fullness, difficulty swallowing, shortness of breath, chest pain, palpitations, dizziness, numbness, tingling, or swelling in her extremities. No changes in bowel or bladder habits aside from the constipation.  ROS negative except for what is listed in HPI. History, Medications, Surgery, SDOH, and Family History reviewed and updated as appropriate.  Objective:  Physical Exam Vitals and nursing note reviewed.  Constitutional:      General: She is not in acute distress.    Appearance: Normal appearance.  HENT:     Head: Normocephalic.     Right Ear: Tympanic membrane normal.     Left Ear: Tympanic membrane  normal.     Nose: Nose normal.     Mouth/Throat:     Mouth: Mucous membranes are moist.     Pharynx: Oropharynx is clear.  Eyes:     Extraocular Movements: Extraocular movements intact.     Conjunctiva/sclera: Conjunctivae normal.     Pupils: Pupils are equal, round, and reactive to light.  Neck:     Vascular: No carotid bruit.  Cardiovascular:     Rate and Rhythm: Normal rate and regular rhythm.     Pulses: Normal pulses.     Heart sounds: Normal heart sounds.  Pulmonary:     Effort: Pulmonary effort is normal.     Breath sounds: Normal breath sounds.  Abdominal:     General: Bowel sounds are normal. There is no distension.     Palpations: Abdomen is soft.     Tenderness: There is no abdominal tenderness. There is no right CVA tenderness, left CVA tenderness or guarding.  Musculoskeletal:     Cervical back: No tenderness.     Right lower leg: No edema.     Left lower leg: No edema.  Lymphadenopathy:     Cervical: No cervical adenopathy.  Skin:    General: Skin is warm and dry.     Capillary Refill: Capillary refill takes less than 2 seconds.  Neurological:     General: No focal deficit present.     Mental Status: She is alert and oriented to person, place, and time.     Motor: No weakness.  Psychiatric:        Mood and Affect: Mood normal.  Assessment & Plan:   Problem List Items Addressed This Visit     Encounter for annual physical exam - Primary   CPE completed today. Review of HM activities and recommendations discussed and provided on AVS. Anticipatory guidance, diet, and exercise recommendations provided. Medications, allergies, and hx reviewed and updated as necessary. Orders placed as listed below.  Plan: - Labs ordered. Will make changes as necessary based on results.  - I will review these results and send recommendations via MyChart or a telephone call.  - F/U with CPE in 1 year or sooner for acute/chronic health needs as directed.         Relevant Orders   CBC with Differential/Platelet   CMP14+EGFR   Lipid panel   History of anemia due to vitamin B12 deficiency   Recheck labs      Relevant Orders   Vitamin B12   History of vitamin D  deficiency   Recheck labs      Relevant Orders   VITAMIN D  25 Hydroxy (Vit-D Deficiency, Fractures)   Other constipation   Chronic constipation likely due to inadequate hydration. Reports difficulty with bowel movements. No current use of laxatives or stool softeners. Discussed potential use of Miralax, which increases intestinal water content, and stool softeners like Colace as alternatives. - Increase water intake - Consider Miralax or Colace if needed      Other Visit Diagnoses       Screening for endocrine, nutritional, metabolic and immunity disorder         Need for tetanus booster             Annella Kief, DNP, AGNP-c

## 2023-12-21 NOTE — Assessment & Plan Note (Signed)

## 2023-12-22 ENCOUNTER — Ambulatory Visit: Payer: Self-pay | Admitting: Nurse Practitioner

## 2023-12-22 LAB — CMP14+EGFR
ALT: 13 IU/L (ref 0–32)
AST: 14 IU/L (ref 0–40)
Albumin: 4.9 g/dL (ref 3.9–4.9)
Alkaline Phosphatase: 60 IU/L (ref 44–121)
BUN/Creatinine Ratio: 15 (ref 9–23)
BUN: 12 mg/dL (ref 6–20)
Bilirubin Total: 0.3 mg/dL (ref 0.0–1.2)
CO2: 19 mmol/L — ABNORMAL LOW (ref 20–29)
Calcium: 10.3 mg/dL — ABNORMAL HIGH (ref 8.7–10.2)
Chloride: 100 mmol/L (ref 96–106)
Creatinine, Ser: 0.82 mg/dL (ref 0.57–1.00)
Globulin, Total: 2.6 g/dL (ref 1.5–4.5)
Glucose: 92 mg/dL (ref 70–99)
Potassium: 4.2 mmol/L (ref 3.5–5.2)
Sodium: 138 mmol/L (ref 134–144)
Total Protein: 7.5 g/dL (ref 6.0–8.5)
eGFR: 95 mL/min/{1.73_m2} (ref >59)

## 2023-12-22 LAB — CBC WITH DIFFERENTIAL/PLATELET
Basophils Absolute: 0.1 10*3/uL (ref 0.0–0.2)
Basos: 1 %
EOS (ABSOLUTE): 0 10*3/uL (ref 0.0–0.4)
Eos: 0 %
Hematocrit: 42 % (ref 34.0–46.6)
Hemoglobin: 13.8 g/dL (ref 11.1–15.9)
Immature Grans (Abs): 0 10*3/uL (ref 0.0–0.1)
Immature Granulocytes: 0 %
Lymphocytes Absolute: 2.9 10*3/uL (ref 0.7–3.1)
Lymphs: 28 %
MCH: 30.7 pg (ref 26.6–33.0)
MCHC: 32.9 g/dL (ref 31.5–35.7)
MCV: 94 fL (ref 79–97)
Monocytes Absolute: 0.7 10*3/uL (ref 0.1–0.9)
Monocytes: 6 %
Neutrophils Absolute: 6.9 10*3/uL (ref 1.4–7.0)
Neutrophils: 65 %
Platelets: 339 10*3/uL (ref 150–450)
RBC: 4.49 x10E6/uL (ref 3.77–5.28)
RDW: 12.1 % (ref 11.7–15.4)
WBC: 10.6 10*3/uL (ref 3.4–10.8)

## 2023-12-22 LAB — LIPID PANEL
Chol/HDL Ratio: 2.5 ratio (ref 0.0–4.4)
Cholesterol, Total: 214 mg/dL — ABNORMAL HIGH (ref 100–199)
HDL: 87 mg/dL (ref >39)
LDL Chol Calc (NIH): 107 mg/dL — ABNORMAL HIGH (ref 0–99)
Triglycerides: 118 mg/dL (ref 0–149)
VLDL Cholesterol Cal: 20 mg/dL (ref 5–40)

## 2023-12-22 LAB — VITAMIN D 25 HYDROXY (VIT D DEFICIENCY, FRACTURES): Vit D, 25-Hydroxy: 46.4 ng/mL (ref 30.0–100.0)

## 2023-12-22 LAB — VITAMIN B12: Vitamin B-12: 489 pg/mL (ref 232–1245)

## 2024-02-02 ENCOUNTER — Other Ambulatory Visit

## 2024-02-04 LAB — PTH, INTACT AND CALCIUM
Calcium: 10 mg/dL (ref 8.7–10.2)
PTH: 23 pg/mL (ref 15–65)

## 2024-02-08 ENCOUNTER — Encounter: Admitting: Nurse Practitioner

## 2024-02-14 ENCOUNTER — Ambulatory Visit: Payer: Self-pay | Admitting: Nurse Practitioner

## 2025-01-02 ENCOUNTER — Encounter: Payer: Self-pay | Admitting: Nurse Practitioner
